# Patient Record
Sex: Male | Born: 1995 | Race: Black or African American | Hispanic: No | Marital: Single | Smoking: Never smoker
Health system: Southern US, Community
[De-identification: ages and names within clinical notes are randomized; demographics above are authoritative.]

---

## 2014-08-07 ENCOUNTER — Emergency Department (HOSPITAL_COMMUNITY)
Admission: EM | Admit: 2014-08-07 | Discharge: 2014-08-07 | Disposition: A | Discharge status: Home / Self Care | Payer: Managed Care, Other (non HMO) | Attending: Emergency Medicine | Admitting: Emergency Medicine

## 2014-08-07 ENCOUNTER — Emergency Department (HOSPITAL_COMMUNITY): Payer: Managed Care, Other (non HMO)

## 2014-08-07 ENCOUNTER — Encounter (HOSPITAL_COMMUNITY): Payer: Self-pay | Admitting: Emergency Medicine

## 2014-08-07 DIAGNOSIS — R079 Chest pain, unspecified: Secondary | ICD-10-CM | POA: Insufficient documentation

## 2014-08-07 DIAGNOSIS — R002 Palpitations: Secondary | ICD-10-CM | POA: Diagnosis not present

## 2014-08-07 DIAGNOSIS — F410 Panic disorder [episodic paroxysmal anxiety] without agoraphobia: Secondary | ICD-10-CM | POA: Insufficient documentation
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
## 2014-08-07 NOTE — Discharge Instructions (Signed)
Chest Pain (Nonspecific) °It is often hard to give a specific diagnosis for the cause of chest pain. There is always a chance that your pain could be related to something serious, such as a heart attack or a blood clot in the lungs. You need to follow up with your health care provider for further evaluation. °CAUSES  °· Heartburn. °· Pneumonia or bronchitis. °· Anxiety or stress. °· Inflammation around your heart (pericarditis) or lung (pleuritis or pleurisy). °· A blood clot in the lung. °· A collapsed lung (pneumothorax). It can develop suddenly on its own (spontaneous pneumothorax) or from trauma to the chest. °· Shingles infection (herpes zoster virus). °The chest wall is composed of bones, muscles, and cartilage. Any of these can be the source of the pain. °· The bones can be bruised by injury. °· The muscles or cartilage can be strained by coughing or overwork. °· The cartilage can be affected by inflammation and become sore (costochondritis). °DIAGNOSIS  °Lab tests or other studies may be needed to find the cause of your pain. Your health care provider may have you take a test called an ambulatory electrocardiogram (ECG). An ECG records your heartbeat patterns over a 24-hour period. You may also have other tests, such as: °· Transthoracic echocardiogram (TTE). During echocardiography, sound waves are used to evaluate how blood flows through your heart. °· Transesophageal echocardiogram (TEE). °· Cardiac monitoring. This allows your health care provider to monitor your heart rate and rhythm in real time. °· Holter monitor. This is a portable device that records your heartbeat and can help diagnose heart arrhythmias. It allows your health care provider to track your heart activity for several days, if needed. °· Stress tests by exercise or by giving medicine that makes the heart beat faster. °TREATMENT  °· Treatment depends on what may be causing your chest pain. Treatment may include: °¨ Acid blockers for  heartburn. °¨ Anti-inflammatory medicine. °¨ Pain medicine for inflammatory conditions. °¨ Antibiotics if an infection is present. °· You may be advised to change lifestyle habits. This includes stopping smoking and avoiding alcohol, caffeine, and chocolate. °· You may be advised to keep your head raised (elevated) when sleeping. This reduces the chance of acid going backward from your stomach into your esophagus. °Most of the time, nonspecific chest pain will improve within 2-3 days with rest and mild pain medicine.  °HOME CARE INSTRUCTIONS  °· If antibiotics were prescribed, take them as directed. Finish them even if you start to feel better. °· For the next few days, avoid physical activities that bring on chest pain. Continue physical activities as directed. °· Do not use any tobacco products, including cigarettes, chewing tobacco, or electronic cigarettes. °· Avoid drinking alcohol. °· Only take medicine as directed by your health care provider. °· Follow your health care provider's suggestions for further testing if your chest pain does not go away. °· Keep any follow-up appointments you made. If you do not go to an appointment, you could develop lasting (chronic) problems with pain. If there is any problem keeping an appointment, call to reschedule. °SEEK MEDICAL CARE IF:  °· Your chest pain does not go away, even after treatment. °· You have a rash with blisters on your chest. °· You have a fever. °SEEK IMMEDIATE MEDICAL CARE IF:  °· You have increased chest pain or pain that spreads to your arm, neck, jaw, back, or abdomen. °· You have shortness of breath. °· You have an increasing cough, or you cough   up blood.  You have severe back or abdominal pain.  You feel nauseous or vomit.  You have severe weakness.  You faint.  You have chills. This is an emergency. Do not wait to see if the pain will go away. Get medical help at once. Call your local emergency services (911 in [place] Do not drive  yourself to the hospital. MAKE SURE YOU:   Understand these instructions.  Will watch your condition.  Will get help right away if you are not doing well or get worse. Document Released: 10/20/2004 Document Revised: 01/15/2013 Document Reviewed: 08/16/2007 [facility] Patient Information 2015 [place], [place]. This information is not intended to replace advice given to you by your health care provider. Make sure you discuss any questions you have with your health care provider. Marijuana Abuse Your exam shows you have used marijuana or pot. There are many health problems related to marijuana abuse. These include:  Bronchitis.  Chronic cough.  Emphysema.  Lung and upper airway cancer. Abusers also experience impairment in:  Memory.  Judgment.  Ability to learn.  Coordination. Students who smoke marijuana:  Get lower grades.  Are less likely to graduate than those who do not. Adults who abuse marijuana:  Have problems at work.  May even lose their jobs due to:  Poor work International aid/development workerperformance.  Absenteeism. Attention, memory, and learning skills have been shown to be diminished for up to 6 months after stopping regular use, and there is evidence that the effects can be cumulative over a lifetime.  Heavier use of marijuana also puts a strain on relationships with friends and loved ones and can lead to moodiness and loss of confidence. Acute intoxication can lead to:  Increased anxiety.  A panic episode. It also increases the risk for having an automobile accident. This is especially true if the pot is combined with alcohol or other intoxicants. Treatment for acute intoxication is rarely needed. However, medicine to reduce anxiety may be helpful in some people. Millions of people are considered to be dependent on marijuana. It is long-term regular use that leads to addiction and all of its complex problems. Information on the problem of addiction and the health problems of long-term  abuse is posted at the [facility]National Institute for Drug Abuse website, http://www.price-smith.com/www.drugabuse.gov. Consult with your doctor or counselor if you want further information and support in handling this common problem. Document Released: 02/18/2004 Document Revised: 04/04/2011 Document Reviewed: 12/05/2006 [facility] Patient Information 2015 [place], [place]. This information is not intended to replace advice given to you by your health care provider. Make sure you discuss any questions you have with your health care provider.

## 2014-08-07 NOTE — ED Notes (Signed)
MD at bedside. 

## 2014-08-07 NOTE — ED Notes (Signed)
Pt. reports central chest tightness , palpitations onset yesterday , also reported smoked a " laced" marijuana yesterday and panic attack /anxiety today .

## 2014-08-07 NOTE — ED Notes (Signed)
Spoke with mini lab i-stat troponin 0.0 

## 2014-08-08 LAB — I-STAT TROPONIN, ED: Troponin i, poc: 0 ng/mL (ref 0.00–0.08)

## 2014-08-08 LAB — CBC
HEMATOCRIT: 43 % (ref 39.0–52.0)
HEMOGLOBIN: 14.8 g/dL (ref 13.0–17.0)
MCH: 30.6 pg (ref 26.0–34.0)
MCHC: 34.4 g/dL (ref 30.0–36.0)
MCV: 88.8 fL (ref 78.0–100.0)
PLATELETS: 247 10*3/uL (ref 150–400)
RBC: 4.84 MIL/uL (ref 4.22–5.81)
RDW: 13 % (ref 11.5–15.5)
WBC: 5.7 10*3/uL (ref 4.0–10.5)

## 2014-08-08 LAB — BASIC METABOLIC PANEL
Anion gap: 11 (ref 5–15)
BUN: 8 mg/dL (ref 6–20)
CO2: 25 mmol/L (ref 22–32)
Calcium: 9.7 mg/dL (ref 8.9–10.3)
Chloride: 100 mmol/L — ABNORMAL LOW (ref 101–111)
Creatinine, Ser: 0.98 mg/dL (ref 0.61–1.24)
GFR calc non Af Amer: >60 mL/min (ref >60)
GLUCOSE: 79 mg/dL (ref 65–99)
Potassium: 3.6 mmol/L (ref 3.5–5.1)
Sodium: 136 mmol/L (ref 135–145)

## 2016-04-09 IMAGING — DX DG CHEST 2V
2 series · 2 of 2 positions shown · non-contrast
Comparison: None.

CLINICAL DATA: Central chest tightness. Palpitations since
yesterday. Smoked marijuana yesterday. Panic attack and anxiety
today.

EXAM:
CHEST  2 VIEW

[chest pa]
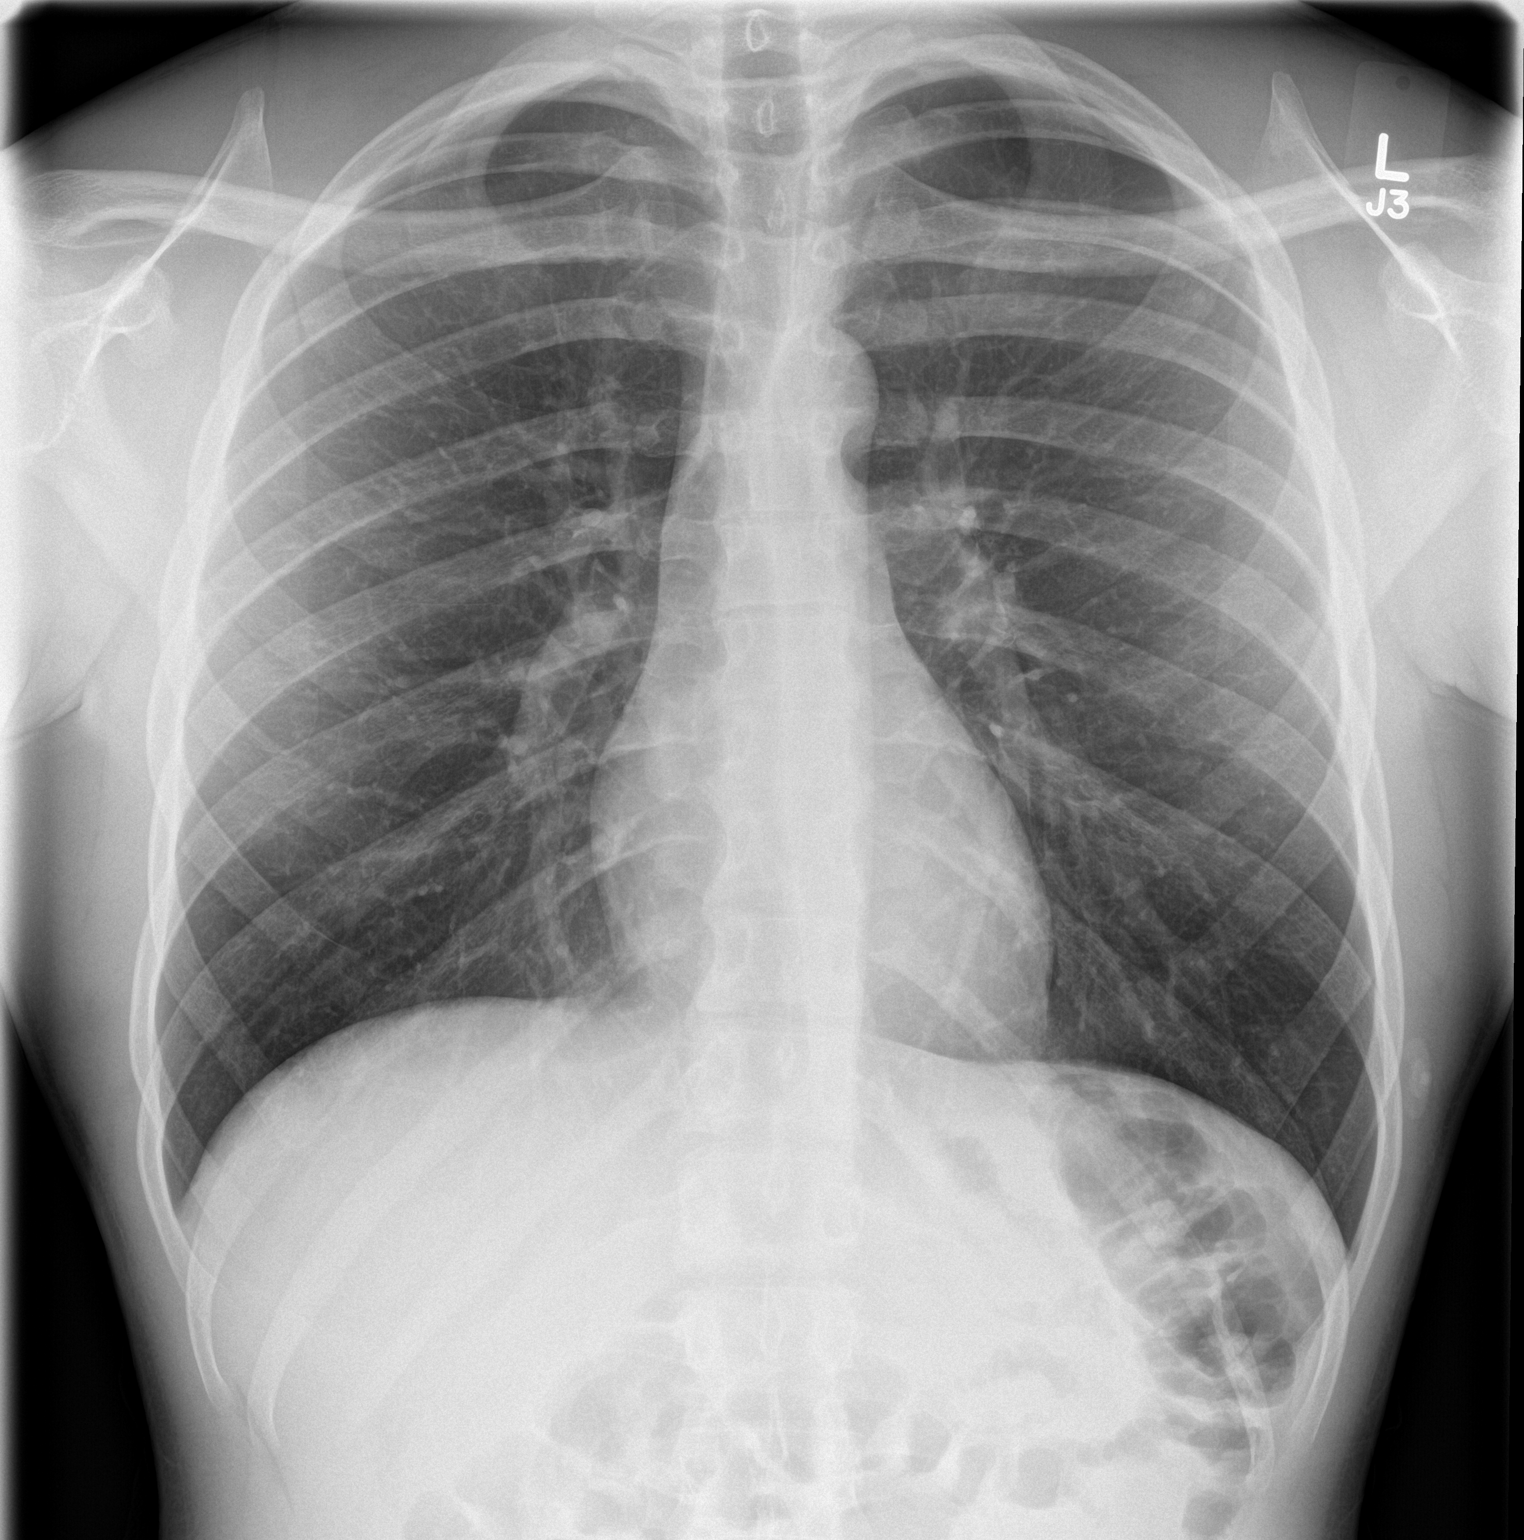

[chest lat]
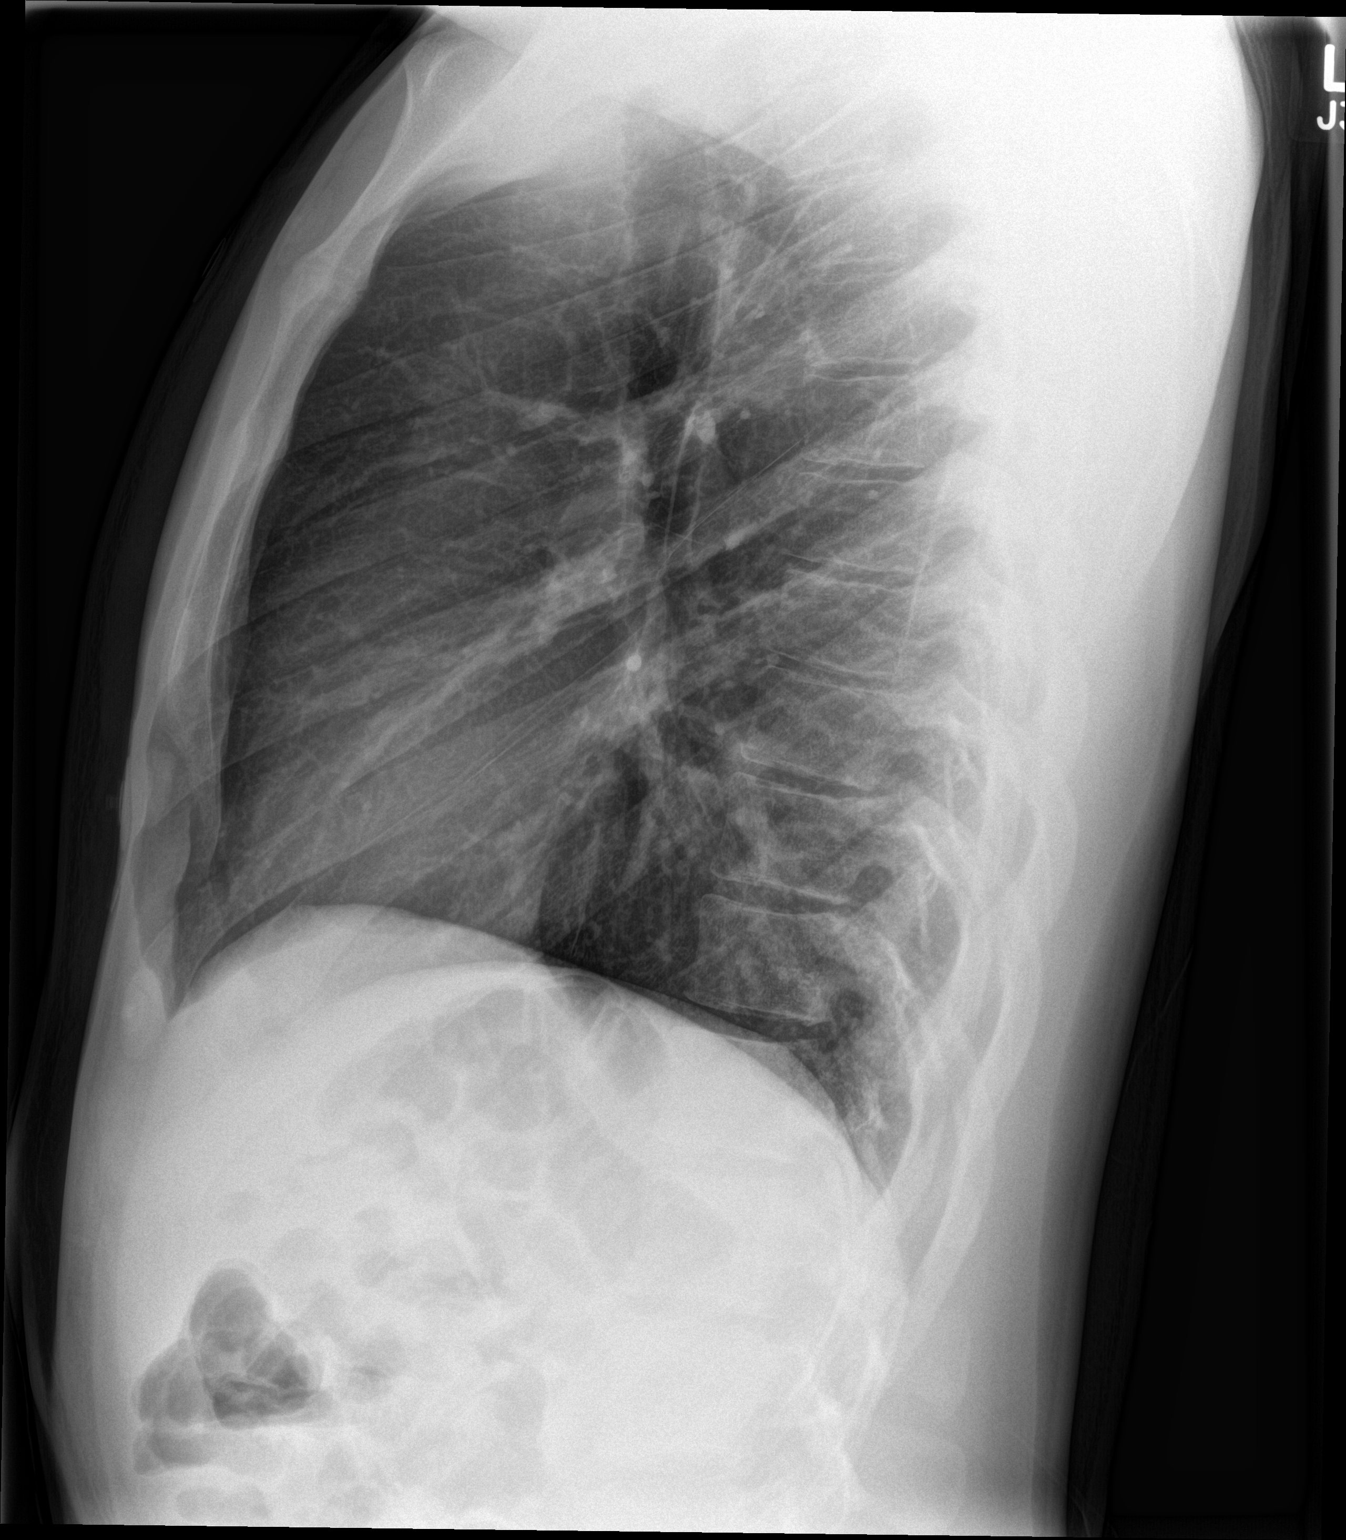

[2 of 2 positions shown; findings below may reference images not displayed]

FINDINGS: Mild hyperinflation. The heart size and mediastinal contours are
within normal limits. Both lungs are clear. The visualized skeletal
structures are unremarkable.
IMPRESSION: No active cardiopulmonary disease.

## 2016-09-29 ENCOUNTER — Telehealth (INDEPENDENT_AMBULATORY_CARE_PROVIDER_SITE_OTHER): Payer: Self-pay | Admitting: *Deleted

## 2016-09-29 ENCOUNTER — Encounter (INDEPENDENT_AMBULATORY_CARE_PROVIDER_SITE_OTHER): Payer: Self-pay | Admitting: *Deleted

## 2016-09-29 NOTE — Telephone Encounter (Signed)
Patient needs trilyte 

## 2016-09-30 MED ORDER — PEG 3350-KCL-NA BICARB-NACL 420 G PO SOLR: 4000.0000 mL | Freq: Once | ORAL | 0 refills | Status: AC

## 2017-07-24 ENCOUNTER — Emergency Department (HOSPITAL_COMMUNITY)
Admission: EM | Admit: 2017-07-24 | Discharge: 2017-07-24 | Disposition: A | Discharge status: Home / Self Care | Payer: Self-pay | Attending: Emergency Medicine | Admitting: Emergency Medicine

## 2017-07-24 DIAGNOSIS — L299 Pruritus, unspecified: Secondary | ICD-10-CM | POA: Insufficient documentation

## 2017-07-24 MED ORDER — DIPHENHYDRAMINE HCL 25 MG PO TABS: ORAL_TABLET | ORAL | 0 refills | Status: AC

## 2017-07-24 MED ORDER — FAMOTIDINE 20 MG PO TABS
ORAL_TABLET | ORAL | 0 refills | Status: AC
Start: 2017-07-24 — End: ?

## 2017-07-24 MED ORDER — PREDNISONE 10 MG PO TABS: ORAL_TABLET | ORAL | 0 refills | Status: AC

## 2017-07-24 NOTE — ED Provider Notes (Signed)
  [facility]   CSN: [id number] Arrival date & time: 07/24/17  [id number]     History   Chief Complaint Chief Complaint  Patient presents with  . Pruritis    HPI Jeremiah Terry is a 22 y.o. male.  Patient presents with symptoms of generalized itching x 3 weeks since starting to work at [employer]. No known allergens. He states that there has been a rash that he identifies as hives but no rash now. He is using topical Calamine Lotion without significant relief. No SOB, lip or tongue swelling at any time.   The history is provided by the patient. No language interpreter was used.    No past medical history on file.  There are no active problems to display for this patient.   No past surgical history on file.      Home Medications    Prior to Admission medications   Not on File    Family History No family history on file.  Social History Social History   Tobacco Use  . Smoking status: Never Smoker  Substance Use Topics  . Alcohol use: Yes  . Drug use: Yes    Types: Marijuana     Allergies   Patient has no known allergies.   Review of Systems Review of Systems  HENT: Negative for facial swelling and trouble swallowing.   Respiratory: Negative for shortness of breath.   Skin: Positive for rash.       Itching     Physical Exam Updated Vital Signs BP 130/89 (BP Location: Right Arm)   Pulse 66   Temp (!) 97.5 F (36.4 C) (Oral)   Resp 18   Ht 6\' 1"  (1.854 m)   Wt 92.5 kg (204 lb)   SpO2 100%   BMI 26.91 kg/m   Physical Exam  Constitutional: He is oriented to person, place, and time. He appears well-developed and well-nourished.  Neck: Normal range of motion.  Pulmonary/Chest: Effort normal.  Musculoskeletal: Normal range of motion.  Neurological: He is alert and oriented to person, place, and time.  Skin: Skin is warm and dry.  No rash, redness or excoriation on generalized skin exam.     Psychiatric: He has a normal mood and affect.     ED Treatments / Results  Labs (all labs ordered are listed, but only abnormal results are displayed) Labs Reviewed - No data to display  EKG None  Radiology No results found.  Procedures Procedures (including critical care time)  Medications Ordered in ED Medications - No data to display   Initial Impression / Assessment and Plan / ED Course  I have reviewed the triage vital signs and the nursing notes.  Pertinent labs & imaging results that were available during my care of the patient were reviewed by me and considered in my medical decision making (see chart for details).     Patient presents with generalized itching and intermittent rash since starting work at [employer] 3 weeks ago. No other symptom of allergy.   Exam is benign. He is requesting referral to allergy doctor which I will provide. Will start on taper dose prednisone and recommend Benadryl and Pepcid.   Final Clinical Impressions(s) / ED Diagnoses   Final diagnoses:  None   1. Pruritis  ED Discharge Orders    None       Elpidio AnisUpstill, Frederica Chrestman, Cordelia Poche-C 07/24/17 0631    Nira Connardama, Pedro Eduardo, MD 07/25/17 [phone]

## 2017-07-24 NOTE — ED Triage Notes (Signed)
Pt c/o of general body itching for 3 weeks. Has been using over the calamine lotion. Pt recently started a new job around the same time. Denies SHOB, Chest pain, or difficulty swallowing
